# Patient Record
Sex: Female | Born: 1990 | Race: White | Hispanic: No | Marital: Single | Smoking: Former smoker
Health system: Southern US, Community
[De-identification: ages and names within clinical notes are randomized; demographics above are authoritative.]

## PROBLEM LIST (undated history)

## (undated) HISTORY — PX: DILATION AND CURETTAGE OF UTERUS: SHX78

---
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
## 2010-07-02 LAB — CBC
Hemoglobin: 12.5 g/dL (ref 12.0–15.0)
MCHC: 35.1 g/dL (ref 30.0–36.0)
MCV: 90.2 fL (ref 78.0–100.0)
RDW: 12.3 % (ref 11.5–15.5)
WBC: 8.5 10*3/uL (ref 4.0–10.5)

## 2010-07-02 LAB — ABO/RH: ABO/RH(D): O POS

## 2010-07-09 LAB — URINALYSIS, ROUTINE W REFLEX MICROSCOPIC
Bilirubin Urine: NEGATIVE
Ketones, ur: NEGATIVE mg/dL
Leukocytes, UA: NEGATIVE
Specific Gravity, Urine: 1.022 (ref 1.005–1.030)

## 2010-07-09 LAB — URINE MICROSCOPIC-ADD ON

## 2010-07-09 LAB — POCT I-STAT, CHEM 8
BUN: 10 mg/dL (ref 6–23)
Calcium, Ion: 1.16 mmol/L (ref 1.12–1.32)
Chloride: 108 mEq/L (ref 96–112)
HCT: 44 % (ref 36.0–46.0)
Potassium: 4 mEq/L (ref 3.5–5.1)
TCO2: 26 mmol/L (ref 0–100)

## 2010-07-09 LAB — DIFFERENTIAL
Basophils Relative: 1 % (ref 0–1)
Eosinophils Absolute: 0.1 10*3/uL (ref 0.0–0.7)
Eosinophils Relative: 2 % (ref 0–5)
Lymphocytes Relative: 18 % (ref 12–46)
Lymphs Abs: 1.4 10*3/uL (ref 0.7–4.0)
Monocytes Absolute: 0.8 10*3/uL (ref 0.1–1.0)
Neutrophils Relative %: 69 % (ref 43–77)

## 2010-07-09 LAB — CBC
Hemoglobin: 14.1 g/dL (ref 12.0–15.0)
MCHC: 33 g/dL (ref 30.0–36.0)
Platelets: 225 10*3/uL (ref 150–400)

## 2010-09-10 ENCOUNTER — Emergency Department (HOSPITAL_COMMUNITY): Payer: Medicaid Other

## 2010-09-10 ENCOUNTER — Emergency Department (HOSPITAL_COMMUNITY)
Admission: EM | Admit: 2010-09-10 | Discharge: 2010-09-10 | Disposition: A | Discharge status: Home / Self Care | Payer: Medicaid Other | Attending: Emergency Medicine | Admitting: Emergency Medicine

## 2010-09-10 DIAGNOSIS — O021 Missed abortion: Secondary | ICD-10-CM | POA: Insufficient documentation

## 2010-09-10 LAB — CBC
Hemoglobin: 12.2 g/dL (ref 12.0–15.0)
Platelets: 214 10*3/uL (ref 150–400)
RBC: 4.19 MIL/uL (ref 3.87–5.11)
WBC: 8.1 10*3/uL (ref 4.0–10.5)

## 2010-09-10 LAB — DIFFERENTIAL
Basophils Absolute: 0 10*3/uL (ref 0.0–0.1)
Basophils Relative: 0 % (ref 0–1)
Eosinophils Relative: 2 % (ref 0–5)
Lymphs Abs: 2.2 10*3/uL (ref 0.7–4.0)
Monocytes Absolute: 0.7 10*3/uL (ref 0.1–1.0)
Monocytes Relative: 8 % (ref 3–12)
Neutrophils Relative %: 63 % (ref 43–77)

## 2010-09-10 LAB — URINALYSIS, ROUTINE W REFLEX MICROSCOPIC
Bilirubin Urine: NEGATIVE
Ketones, ur: NEGATIVE mg/dL
Leukocytes, UA: NEGATIVE
Protein, ur: NEGATIVE mg/dL
Specific Gravity, Urine: 1.021 (ref 1.005–1.030)
Urobilinogen, UA: 0.2 mg/dL (ref 0.0–1.0)

## 2010-09-10 LAB — ABO/RH: ABO/RH(D): O POS

## 2010-09-10 LAB — WET PREP, GENITAL

## 2010-09-10 LAB — POCT I-STAT, CHEM 8
Chloride: 104 mEq/L (ref 96–112)
Creatinine, Ser: 1.1 mg/dL (ref 0.4–1.2)
Glucose, Bld: 103 mg/dL — ABNORMAL HIGH (ref 70–99)
HCT: 35 % — ABNORMAL LOW (ref 36.0–46.0)
Hemoglobin: 11.9 g/dL — ABNORMAL LOW (ref 12.0–15.0)
Potassium: 3.6 mEq/L (ref 3.5–5.1)
Sodium: 136 mEq/L (ref 135–145)

## 2010-09-10 LAB — URINE MICROSCOPIC-ADD ON

## 2010-09-11 LAB — GC/CHLAMYDIA PROBE AMP, GENITAL: Chlamydia, DNA Probe: NEGATIVE

## 2010-09-12 LAB — URINE CULTURE

## 2010-09-13 ENCOUNTER — Other Ambulatory Visit (HOSPITAL_COMMUNITY): Payer: Self-pay | Admitting: Obstetrics and Gynecology

## 2010-09-13 DIAGNOSIS — O200 Threatened abortion: Secondary | ICD-10-CM

## 2010-09-14 ENCOUNTER — Other Ambulatory Visit (HOSPITAL_COMMUNITY): Payer: Self-pay | Admitting: Obstetrics and Gynecology

## 2010-09-14 ENCOUNTER — Ambulatory Visit (HOSPITAL_COMMUNITY)
Admission: RE | Admit: 2010-09-14 | Discharge: 2010-09-14 | Disposition: A | Discharge status: Home / Self Care | Payer: Medicaid Other | Source: Ambulatory Visit | Attending: Obstetrics and Gynecology | Admitting: Obstetrics and Gynecology

## 2010-09-14 DIAGNOSIS — O200 Threatened abortion: Secondary | ICD-10-CM

## 2010-09-14 DIAGNOSIS — O99330 Smoking (tobacco) complicating pregnancy, unspecified trimester: Secondary | ICD-10-CM | POA: Insufficient documentation

## 2010-09-14 DIAGNOSIS — O209 Hemorrhage in early pregnancy, unspecified: Secondary | ICD-10-CM | POA: Insufficient documentation

## 2010-09-14 DIAGNOSIS — O368390 Maternal care for abnormalities of the fetal heart rate or rhythm, unspecified trimester, not applicable or unspecified: Secondary | ICD-10-CM | POA: Insufficient documentation

## 2010-09-16 ENCOUNTER — Other Ambulatory Visit: Payer: Self-pay | Admitting: Obstetrics and Gynecology

## 2010-09-16 ENCOUNTER — Inpatient Hospital Stay (HOSPITAL_COMMUNITY)
Admission: AD | Admit: 2010-09-16 | Discharge: 2010-09-16 | Disposition: A | Discharge status: Home / Self Care | Payer: Medicaid Other | Source: Ambulatory Visit | Attending: Obstetrics and Gynecology | Admitting: Obstetrics and Gynecology

## 2010-09-16 DIAGNOSIS — O200 Threatened abortion: Secondary | ICD-10-CM | POA: Insufficient documentation

## 2010-09-16 LAB — CBC
HCT: 37.3 % (ref 36.0–46.0)
MCH: 29.9 pg (ref 26.0–34.0)
MCV: 86.3 fL (ref 78.0–100.0)
RBC: 4.32 MIL/uL (ref 3.87–5.11)
WBC: 16.5 10*3/uL — ABNORMAL HIGH (ref 4.0–10.5)

## 2011-01-18 ENCOUNTER — Emergency Department (HOSPITAL_COMMUNITY)
Admission: EM | Admit: 2011-01-18 | Discharge: 2011-01-19 | Disposition: A | Discharge status: Home / Self Care | Payer: No Typology Code available for payment source | Attending: Emergency Medicine | Admitting: Emergency Medicine

## 2011-01-18 DIAGNOSIS — R0789 Other chest pain: Secondary | ICD-10-CM | POA: Insufficient documentation

## 2011-01-18 DIAGNOSIS — S0990XA Unspecified injury of head, initial encounter: Secondary | ICD-10-CM | POA: Insufficient documentation

## 2011-01-18 DIAGNOSIS — R51 Headache: Secondary | ICD-10-CM | POA: Insufficient documentation

## 2011-01-18 DIAGNOSIS — M2550 Pain in unspecified joint: Secondary | ICD-10-CM | POA: Insufficient documentation

## 2011-01-18 DIAGNOSIS — S62319A Displaced fracture of base of unspecified metacarpal bone, initial encounter for closed fracture: Secondary | ICD-10-CM | POA: Insufficient documentation

## 2011-01-18 DIAGNOSIS — M79609 Pain in unspecified limb: Secondary | ICD-10-CM | POA: Insufficient documentation

## 2011-01-19 ENCOUNTER — Emergency Department (HOSPITAL_COMMUNITY): Payer: No Typology Code available for payment source

## 2011-10-23 ENCOUNTER — Emergency Department (HOSPITAL_COMMUNITY): Payer: No Typology Code available for payment source

## 2011-10-23 ENCOUNTER — Emergency Department (HOSPITAL_COMMUNITY)
Admission: EM | Admit: 2011-10-23 | Discharge: 2011-10-23 | Disposition: A | Discharge status: Home / Self Care | Payer: No Typology Code available for payment source | Attending: Family Medicine | Admitting: Family Medicine

## 2011-10-23 ENCOUNTER — Encounter (HOSPITAL_COMMUNITY): Payer: Self-pay | Admitting: *Deleted

## 2011-10-23 DIAGNOSIS — IMO0002 Reserved for concepts with insufficient information to code with codable children: Secondary | ICD-10-CM | POA: Insufficient documentation

## 2011-10-23 DIAGNOSIS — S43402A Unspecified sprain of left shoulder joint, initial encounter: Secondary | ICD-10-CM

## 2011-10-23 DIAGNOSIS — Y92009 Unspecified place in unspecified non-institutional (private) residence as the place of occurrence of the external cause: Secondary | ICD-10-CM | POA: Insufficient documentation

## 2011-10-23 DIAGNOSIS — S0003XA Contusion of scalp, initial encounter: Secondary | ICD-10-CM | POA: Insufficient documentation

## 2011-10-23 DIAGNOSIS — F17200 Nicotine dependence, unspecified, uncomplicated: Secondary | ICD-10-CM | POA: Insufficient documentation

## 2011-10-23 DIAGNOSIS — S0083XA Contusion of other part of head, initial encounter: Secondary | ICD-10-CM

## 2011-10-23 DIAGNOSIS — S80219A Abrasion, unspecified knee, initial encounter: Secondary | ICD-10-CM

## 2011-10-23 NOTE — ED Provider Notes (Signed)
Medical screening examination/treatment/procedure(s) were performed by non-physician practitioner and as supervising physician I was immediately available for consultation/collaboration. Taesean Reth, MD, FACEP   Tyna Huertas L Denney Shein, MD 10/23/11 2324 

## 2011-10-23 NOTE — ED Notes (Signed)
Ortho called for sling 
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
## 2011-10-23 NOTE — ED Notes (Signed)
Pt states she did not have any loc. Pt states she is only having neck pain
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
## 2012-07-23 ENCOUNTER — Other Ambulatory Visit: Payer: Self-pay | Admitting: Family Medicine

## 2012-07-23 DIAGNOSIS — M25512 Pain in left shoulder: Secondary | ICD-10-CM

## 2012-07-24 ENCOUNTER — Other Ambulatory Visit: Payer: No Typology Code available for payment source

## 2012-07-26 ENCOUNTER — Ambulatory Visit
Admission: RE | Admit: 2012-07-26 | Discharge: 2012-07-26 | Disposition: A | Discharge status: Home / Self Care | Payer: Federal, State, Local not specified - PPO | Source: Ambulatory Visit | Attending: Family Medicine | Admitting: Family Medicine

## 2012-07-26 DIAGNOSIS — M25512 Pain in left shoulder: Secondary | ICD-10-CM

## 2013-03-09 IMAGING — US US OB TRANSVAGINAL
1 series · 13 of 28 positions shown · non-contrast
Comparison: None

CLINICAL DATA: Pregnant, abdominal pain, vaginal bleeding;
quantitative beta HCG 5141 on 09/10/2010

OBSTETRIC <14 WK US AND TRANSVAGINAL OB US
TECHNIQUE: Both transabdominal and transvaginal ultrasound
examinations were performed for complete evaluation of the
gestation as well as the maternal uterus, adnexal regions, and
pelvic cul-de-sac.  Transvaginal technique was performed to assess
early pregnancy.

[Series 1: us ob transvaginal · 0.26mm/px · 13 of 47 slices shown]
[im 2/47]
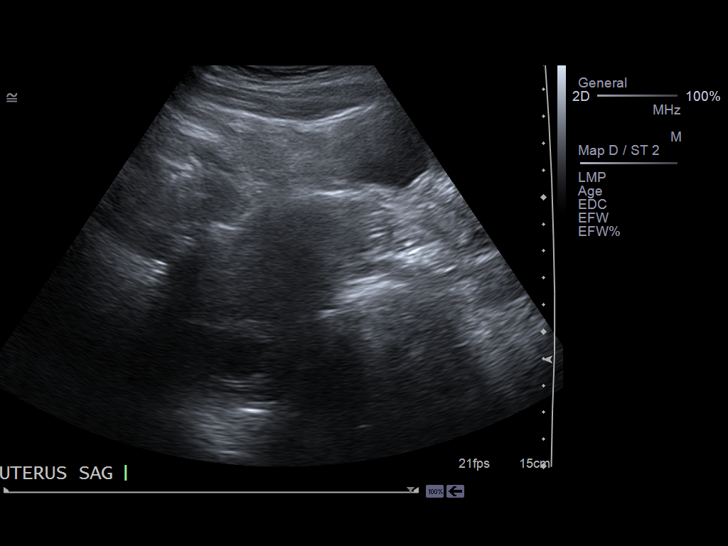
[im 6/47]
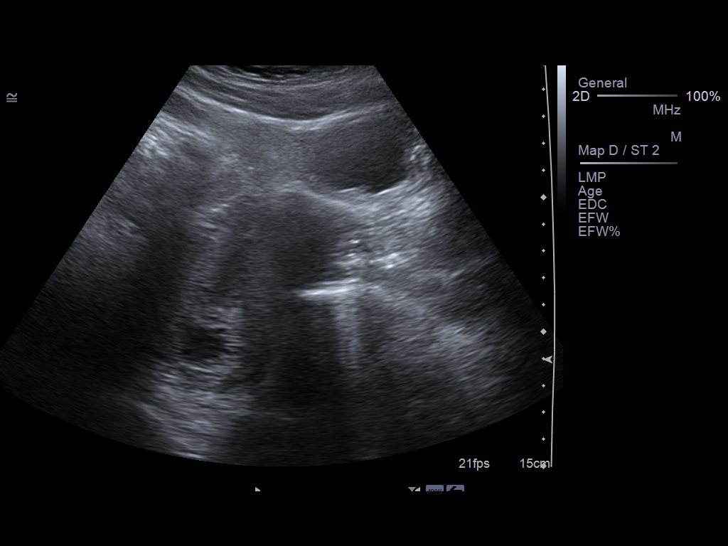
[im 9/47]
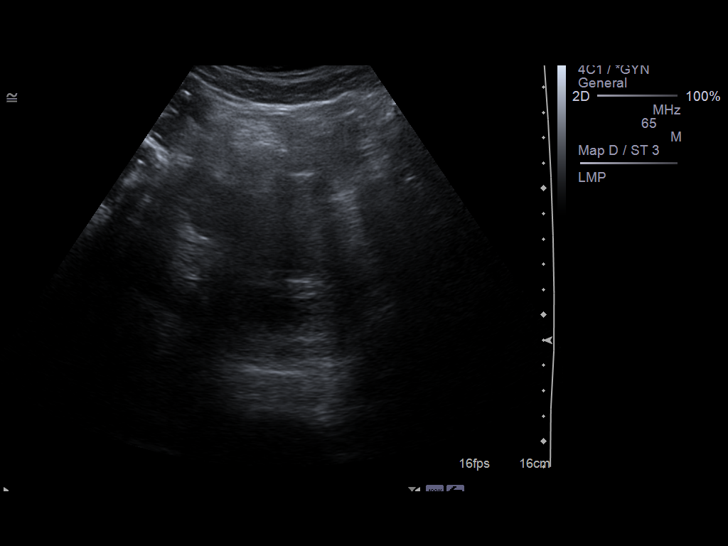
[im 12/47]
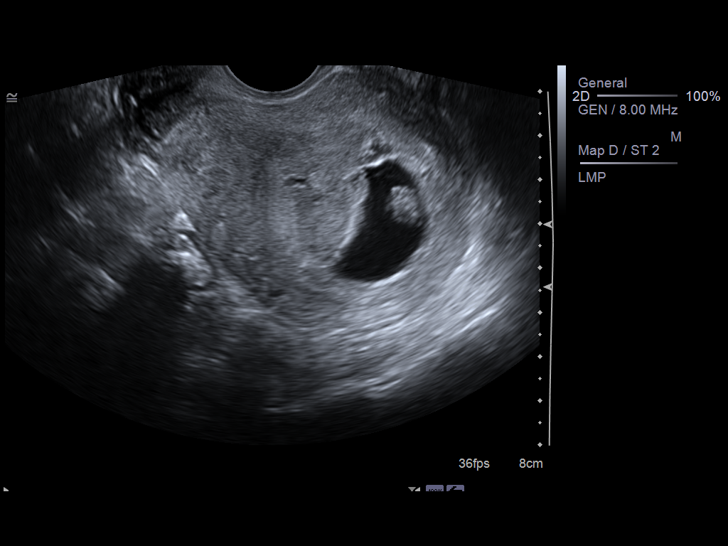
[im 16/47]
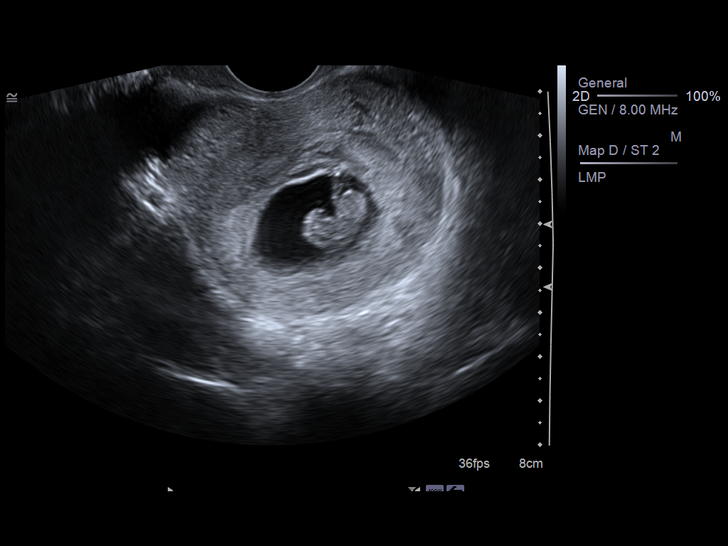
[im 19/47]
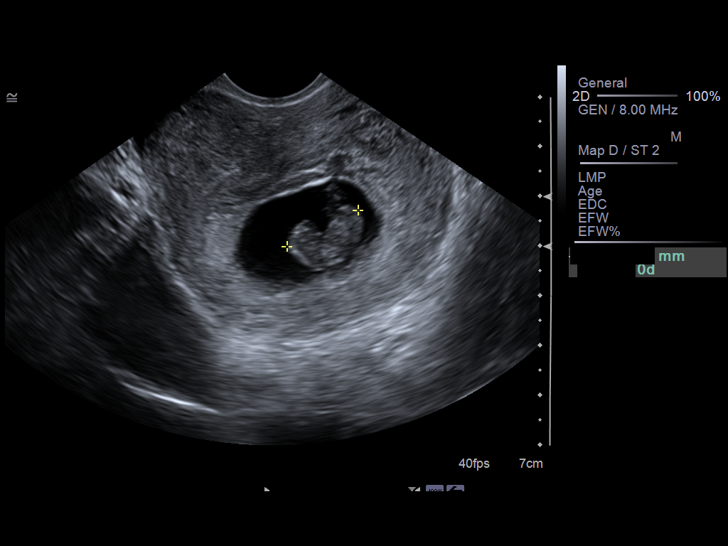
[im 24/47]
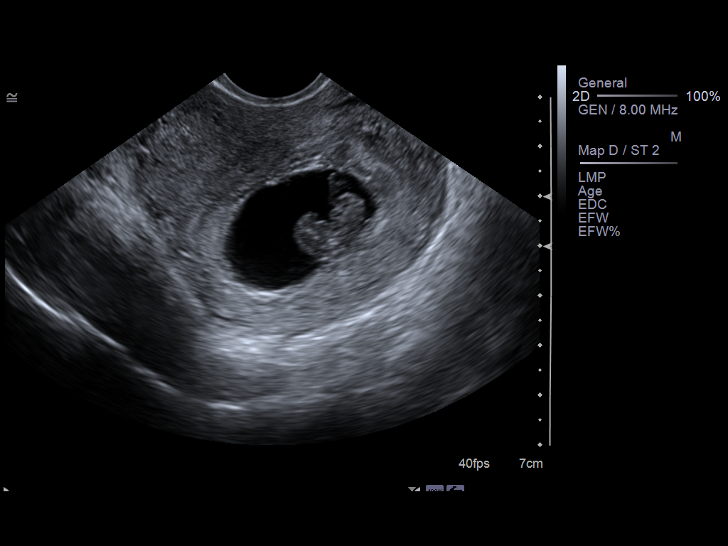
[im 28/47]
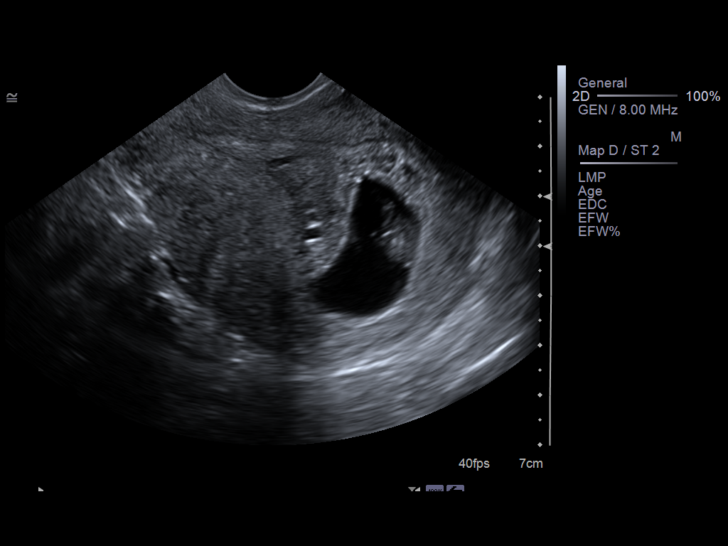
[im 31/47]
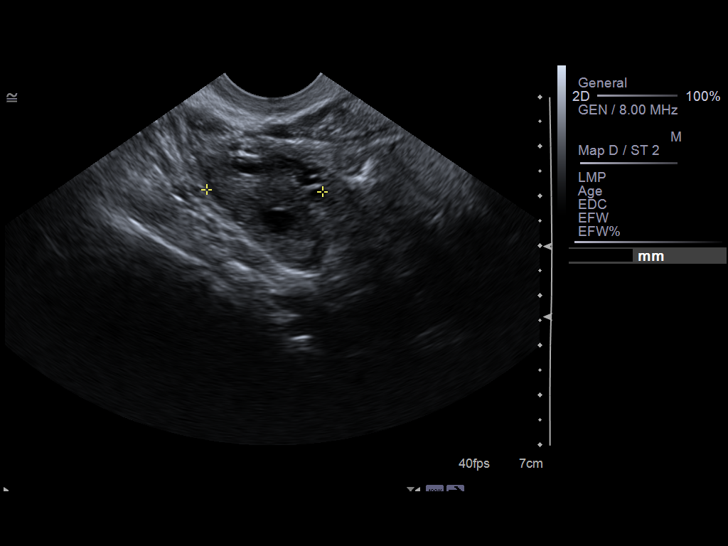
[im 35/47]
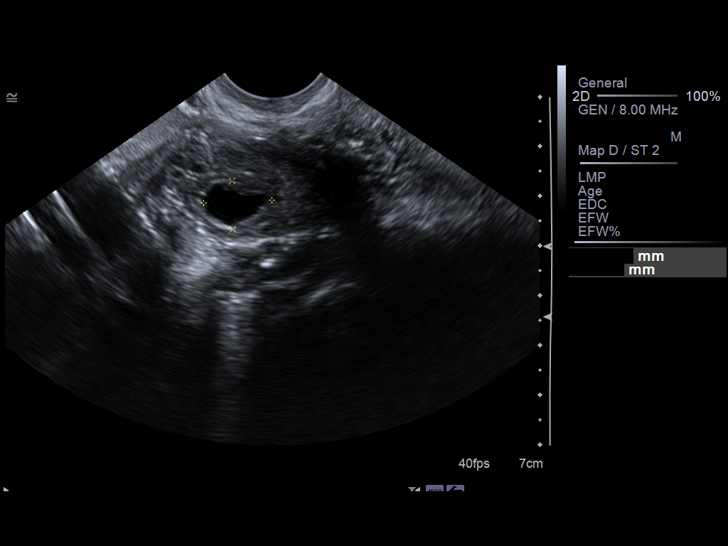
[im 38/47]
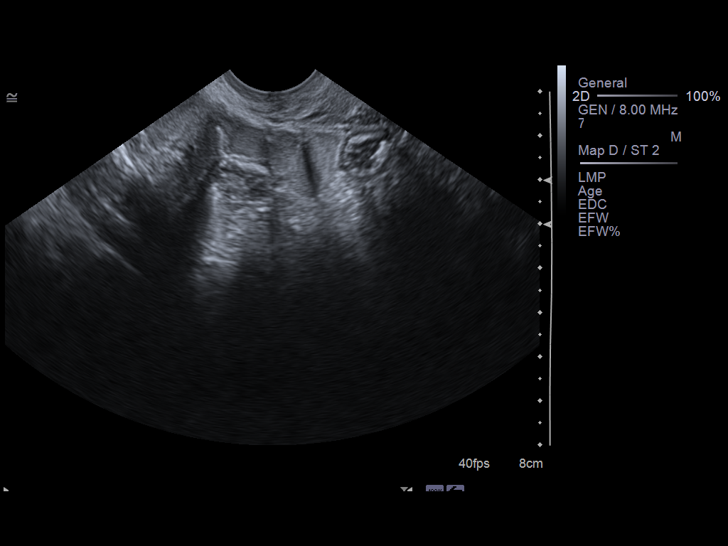
[im 41/47]
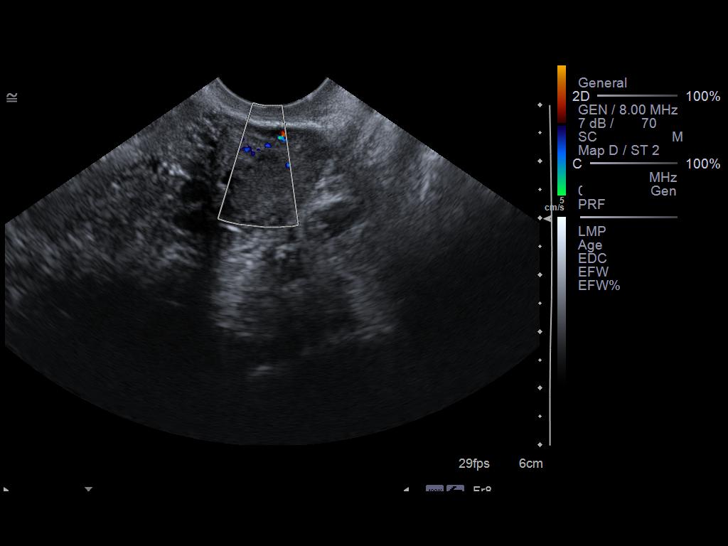
[im 45/47]
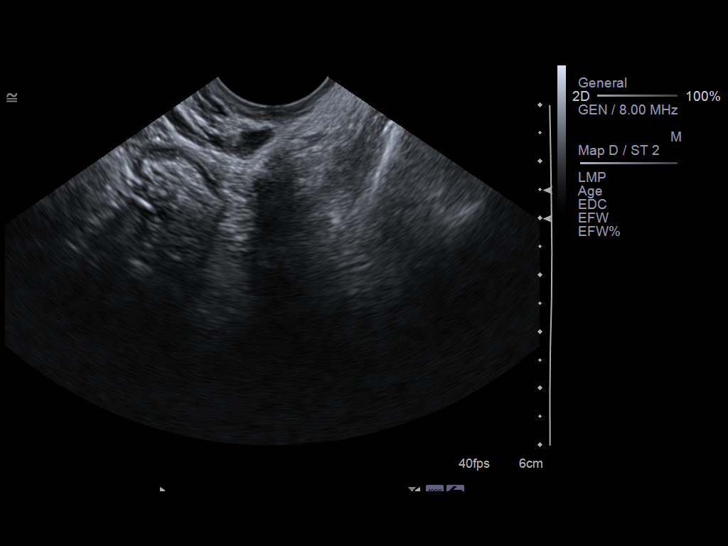

[13 of 28 positions shown; findings below may reference images not displayed]

Intrauterine gestational sac:  Visualized/abnormal shape
Yolk sac: Not visualized
Embryo: Visualized
Cardiac Activity: Not detected
Heart Rate: N/A bpm

CRL:   15.9 mm  8 w   0 d           US EDC: 05/02/2011

Maternal uterus/adnexae:
Small to moderate sized subchorionic hemorrhage, slightly deforming
gestational sac.
Right ovary normal size and morphology, 3.3 x 2.3 x 2.3 cm.
Left ovary normal size and morphology, 3.1 x 1.7 x 2.1 cm.
No adnexal masses or free pelvic fluid.
IMPRESSION: Single intrauterine gestation identified within uterus, measuring 8
weeks 0 days EGA based on crown-rump length.
Small to moderate sized subchorionic hemorrhage.
No fetal cardiac activity is identified, though this is expected to
be present at this size of fetal pole and estimated gestational
age.
Findings are most consistent with fetal demise.

## 2019-07-15 ENCOUNTER — Ambulatory Visit (INDEPENDENT_AMBULATORY_CARE_PROVIDER_SITE_OTHER): Payer: Medicaid Other

## 2019-07-15 ENCOUNTER — Other Ambulatory Visit: Payer: Self-pay

## 2019-07-15 VITALS — BP 134/78 | HR 109 | Temp 98.7°F | Ht 67.0 in | Wt 177.1 lb

## 2019-07-15 DIAGNOSIS — Z3490 Encounter for supervision of normal pregnancy, unspecified, unspecified trimester: Secondary | ICD-10-CM

## 2019-07-15 DIAGNOSIS — Z3201 Encounter for pregnancy test, result positive: Secondary | ICD-10-CM | POA: Diagnosis not present

## 2019-07-15 LAB — POCT URINE PREGNANCY: Preg Test, Ur: POSITIVE — AB

## 2019-07-15 MED ORDER — BLOOD PRESSURE KIT DEVI: 1.0000 | 0 refills | Status: AC

## 2019-07-15 NOTE — Progress Notes (Signed)
Ms. Fiske presents today for UPT. She has no unusual complaints.  LMP: 05/12/2019 EDD  02/16/2020  [redacted]w[redacted]d    OBJECTIVE: Appears well, in no apparent distress.  OB History    Gravida  3   Para      Term      Preterm      AB  2   Living  0     SAB      TAB      Ectopic      Multiple      Live Births             Home UPT Result: POSITIVE X2 In-Office UPT result: POSITIVE  I have reviewed the patient's medical, obstetrical, social, and family histories, and medications.   ASSESSMENT: Positive pregnancy test  PLAN Prenatal care to be completed at: [facility] BP Cuff ordered

## 2019-08-04 ENCOUNTER — Other Ambulatory Visit: Payer: Self-pay

## 2019-08-04 ENCOUNTER — Other Ambulatory Visit (HOSPITAL_COMMUNITY)
Admission: RE | Admit: 2019-08-04 | Discharge: 2019-08-04 | Disposition: A | Discharge status: Home / Self Care | Payer: Medicaid Other | Source: Ambulatory Visit | Attending: Obstetrics & Gynecology | Admitting: Obstetrics & Gynecology

## 2019-08-04 ENCOUNTER — Ambulatory Visit (INDEPENDENT_AMBULATORY_CARE_PROVIDER_SITE_OTHER): Payer: Medicaid Other | Admitting: Obstetrics & Gynecology

## 2019-08-04 ENCOUNTER — Encounter: Payer: Self-pay | Admitting: Obstetrics & Gynecology

## 2019-08-04 VITALS — BP 118/75 | HR 111 | Wt 178.1 lb

## 2019-08-04 DIAGNOSIS — F17210 Nicotine dependence, cigarettes, uncomplicated: Secondary | ICD-10-CM | POA: Diagnosis not present

## 2019-08-04 DIAGNOSIS — Z3490 Encounter for supervision of normal pregnancy, unspecified, unspecified trimester: Secondary | ICD-10-CM | POA: Insufficient documentation

## 2019-08-04 NOTE — Progress Notes (Signed)
  Subjective:    Suzanne Gross is a [id number] [redacted]w[redacted]d being seen today for her first obstetrical visit.  Her obstetrical history is significant for smoker. Patient does intend to breast feed. Pregnancy history fully reviewed.  Patient reports no complaints.  Vitals:   08/04/19 0814  BP: 118/75  Pulse: (!) 111  Weight: 178 lb 1.6 oz (80.8 kg)    HISTORY: OB History  Gravida Para Term Preterm AB Living  3       2 0  SAB TAB Ectopic Multiple Live Births               # Outcome Date GA Lbr Len/2nd Weight Sex Delivery Anes PTL Lv  3 Current           2 AB           1 AB            No past medical history on file. Past Surgical History:  Procedure Laterality Date  . DILATION AND CURETTAGE OF UTERUS     No family history on file.   Exam    Uterus:     Pelvic Exam:    Perineum: No Hemorrhoids   Vulva: normal   Vagina:  normal mucosa   pH:     Cervix: no lesions   Adnexa: normal adnexa   Bony Pelvis: average  System: Breast:  normal appearance, no masses or tenderness   Skin: normal coloration and turgor, no rashes    Neurologic: oriented, normal mood   Extremities: normal strength, tone, and muscle mass   HEENT PERRLA and thyroid without masses   Mouth/Teeth mucous membranes moist, pharynx normal without lesions and few missing teeth   Neck supple   Cardiovascular: regular rate and rhythm, no murmurs or gallops   Respiratory:  appears well, vitals normal, no respiratory distress, acyanotic, normal RR, neck free of mass or lymphadenopathy, chest clear, no wheezing, crepitations, rhonchi, normal symmetric air entry   Abdomen: soft, non-tender; bowel sounds normal; no masses,  no organomegaly   Urinary: urethral meatus normal      Assessment:    Pregnancy: [id number] Patient Active Problem List   Diagnosis Date Noted  . Cigarette smoker 08/04/2019  . Supervision of normal pregnancy, antepartum 07/15/2019        Plan:     Initial labs drawn. Prenatal  vitamins. Problem list reviewed and updated. Genetic Screening discussed [facility] and [facility]  Ultrasound discussed; fetal survey: ordered.  Follow up in 4 weeks. 50% of 30 min visit spent on counseling and coordination of care.  Quit smoking advised   Scheryl Darter 08/04/2019

## 2019-08-04 NOTE — Patient Instructions (Signed)

## 2019-08-05 LAB — OBSTETRIC PANEL, INCLUDING HIV
Antibody Screen: NEGATIVE
Basophils Absolute: 0.1 10*3/uL (ref 0.0–0.2)
Basos: 0 %
EOS (ABSOLUTE): 0.3 10*3/uL (ref 0.0–0.4)
Eos: 3 %
HIV Screen 4th Generation wRfx: NONREACTIVE
Hematocrit: 39.2 % (ref 34.0–46.6)
Hemoglobin: 13 g/dL (ref 11.1–15.9)
Hepatitis B Surface Ag: NEGATIVE
Immature Grans (Abs): 0.1 10*3/uL (ref 0.0–0.1)
Immature Granulocytes: 1 %
Lymphocytes Absolute: 2.3 10*3/uL (ref 0.7–3.1)
Lymphs: 18 %
MCH: 31.1 pg (ref 26.6–33.0)
MCHC: 33.2 g/dL (ref 31.5–35.7)
MCV: 94 fL (ref 79–97)
Monocytes Absolute: 1 10*3/uL — ABNORMAL HIGH (ref 0.1–0.9)
Monocytes: 8 %
Neutrophils Absolute: 9 10*3/uL — ABNORMAL HIGH (ref 1.4–7.0)
Neutrophils: 70 %
Platelets: 272 10*3/uL (ref 150–450)
RBC: 4.18 x10E6/uL (ref 3.77–5.28)
RDW: 12.6 % (ref 11.7–15.4)
RPR Ser Ql: NONREACTIVE
Rh Factor: POSITIVE
Rubella Antibodies, IGG: 2.51 index (ref >0.99)
WBC: 12.8 10*3/uL — ABNORMAL HIGH (ref 3.4–10.8)

## 2019-08-05 LAB — CYTOLOGY - PAP
Chlamydia: NEGATIVE
Comment: NEGATIVE
Comment: NORMAL
Diagnosis: NEGATIVE
Neisseria Gonorrhea: NEGATIVE

## 2019-08-07 LAB — CULTURE, OB URINE

## 2019-08-07 LAB — URINE CULTURE, OB REFLEX

## 2019-08-16 ENCOUNTER — Encounter: Payer: Self-pay | Admitting: Obstetrics and Gynecology

## 2019-09-01 ENCOUNTER — Encounter: Payer: Self-pay | Admitting: Obstetrics and Gynecology

## 2019-09-01 ENCOUNTER — Other Ambulatory Visit: Payer: Self-pay

## 2019-09-01 ENCOUNTER — Encounter: Payer: Self-pay | Admitting: Emergency Medicine

## 2019-09-01 ENCOUNTER — Ambulatory Visit
Admission: EM | Admit: 2019-09-01 | Discharge: 2019-09-01 | Disposition: A | Discharge status: Home / Self Care | Payer: Medicaid Other

## 2019-09-01 ENCOUNTER — Telehealth (INDEPENDENT_AMBULATORY_CARE_PROVIDER_SITE_OTHER): Payer: Medicaid Other | Admitting: Obstetrics and Gynecology

## 2019-09-01 DIAGNOSIS — Z3490 Encounter for supervision of normal pregnancy, unspecified, unspecified trimester: Secondary | ICD-10-CM

## 2019-09-01 DIAGNOSIS — M79601 Pain in right arm: Secondary | ICD-10-CM

## 2019-09-01 NOTE — ED Triage Notes (Signed)
Patient has a superficial wound on right forearm.  Patient reports swelling and a pulling sensation in forearm.  Reports there was a small bruise  on distal forearm yesterday.  No bruise visible today

## 2019-09-01 NOTE — Progress Notes (Signed)
Pt did not answer for her OB visit. She will be called to reschedule.   Baldemar Lenis, M.D. Attending [facility])

## 2019-09-01 NOTE — Discharge Instructions (Signed)
Ice compress to the area. Tylenol for pain. If noticing spreading redness, warmth, follow up for reevaluation. If symptoms not improving, follow up with sports medicine for further evaluation.

## 2019-09-01 NOTE — ED Provider Notes (Signed)
[facility]    CSN: [id number] Arrival date & time: 09/01/19  1818      History   Chief Complaint Chief Complaint  Patient presents with  . Arm Problem    HPI Suzanne Gross is a 29 y.o. female.   29 year old female who is [redacted] weeks pregnant comes in for 2-day history of right forearm pain.  Noticed swelling to the area, with small bruise and superficial wound.  Bruising has since resolved, but has felt swelling getting larger.  Has numbness/tingling to the hand, and pulling sensation to the forearm with some movements. Denies itching, erythema, warmth. Denies fever. No known injury. Works at [employer].      History reviewed. No pertinent past medical history.  Patient Active Problem List   Diagnosis Date Noted  . Cigarette smoker 08/04/2019  . Supervision of normal pregnancy, antepartum 07/15/2019    Past Surgical History:  Procedure Laterality Date  . DILATION AND CURETTAGE OF UTERUS      OB History    Gravida  3   Para      Term      Preterm      AB  2   Living  0     SAB      TAB      Ectopic      Multiple      Live Births               Home Medications    Prior to Admission medications   Medication Sig Start Date End Date Taking? Authorizing Provider  Prenatal MV-Min-Fe Fum-FA-DHA (PRENATAL 1 PO) Take by mouth.   Yes [provider]  Blood Pressure Monitoring (BLOOD PRESSURE KIT) DEVI 1 kit by Does not apply route once a week. Check Blood Pressure regularly and record readings into the Babyscripts App.  Large Cuff.  DX O90.0 07/15/19   Shelly Bombard, MD  etonogestrel (IMPLANON) 68 MG IMPL implant Inject 1 each into the skin once.  09/01/19  [provider]    Family History History reviewed. No pertinent family history.  Social History Social History   Tobacco Use  . Smoking status: Former Smoker    Types: Cigarettes    Quit date: 07/13/2019    Years since quitting: 0.1  . Smokeless tobacco: Never  Used  Substance Use Topics  . Alcohol use: Yes    Comment: occ  . Drug use: No     Allergies   Patient has no known allergies.   Review of Systems Review of Systems  Reason unable to perform ROS: See HPI as above.     Physical Exam Triage Vital Signs ED Triage Vitals  Enc Vitals Group     BP 09/01/19 1835 103/60     Pulse Rate 09/01/19 1835 90     Resp 09/01/19 1835 18     Temp 09/01/19 1835 98.5 F (36.9 C)     Temp Source 09/01/19 1835 Oral     SpO2 09/01/19 1835 96 %     Weight --      Height --      Head Circumference --      Peak Flow --      Pain Score 09/01/19 1832 7     Pain Loc --      Pain Edu? --      Excl. in [place]? --    No data found.  Updated Vital Signs BP 103/60 (BP Location: Left Arm)  Pulse 90   Temp 98.5 F (36.9 C) (Oral)   Resp 18   LMP 05/12/2019 (Approximate)   SpO2 96%   Visual Acuity Right Eye Distance:   Left Eye Distance:   Bilateral Distance:    Right Eye Near:   Left Eye Near:    Bilateral Near:     Physical Exam Constitutional:      General: She is not in acute distress.    Appearance: Normal appearance. She is well-developed. She is not toxic-appearing or diaphoretic.  HENT:     Head: Normocephalic and atraumatic.  Eyes:     Conjunctiva/sclera: Conjunctivae normal.     Pupils: Pupils are equal, round, and reactive to light.  Pulmonary:     Effort: Pulmonary effort is normal. No respiratory distress.     Comments: Speaking in full sentences without difficulty Musculoskeletal:       Arms:     Cervical back: Normal range of motion and neck supple.     Comments: Full ROM of elbow, wrist, fingers. Strength 5/5 BUE. Sensation 3-4/5 of right 4th and 5th digit. Radial pulse 2+, cap refill <2s  Skin:    General: Skin is warm and dry.  Neurological:     Mental Status: She is alert and oriented to person, place, and time.      UC Treatments / Results  Labs (all labs ordered are listed, but only abnormal results  are displayed) Labs Reviewed - No data to display  EKG   Radiology No results found.  Procedures Procedures (including critical care time)  Medications Ordered in UC Medications - No data to display  Initial Impression / Assessment and Plan / UC Course  I have reviewed the triage vital signs and the nursing notes.  Pertinent labs & imaging results that were available during my care of the patient were reviewed by me and considered in my medical decision making (see chart for details).    No alarming signs on exam. Will start ice compress, tylenol for pain. Discussed to follow up with sports medicine if symptoms not improving. Return precautions given.  Final Clinical Impressions(s) / UC Diagnoses   Final diagnoses:  Right arm pain   ED Prescriptions    None     PDMP not reviewed this encounter.   Ok Edwards, PA-C 09/01/19 [phone]

## 2019-09-01 NOTE — Progress Notes (Signed)
Virtual ROB   CC:    

## 2019-09-09 ENCOUNTER — Telehealth: Payer: Self-pay

## 2019-09-09 ENCOUNTER — Telehealth (INDEPENDENT_AMBULATORY_CARE_PROVIDER_SITE_OTHER): Payer: Medicaid Other | Admitting: Obstetrics

## 2019-09-09 DIAGNOSIS — Z3490 Encounter for supervision of normal pregnancy, unspecified, unspecified trimester: Secondary | ICD-10-CM

## 2019-09-09 NOTE — Progress Notes (Signed)
Patient did not answer nurse's phone call  Brock Bad, MD 09/09/2019 4:15 PM

## 2019-09-09 NOTE — Telephone Encounter (Signed)
Unable to reach pt x2 for video visit

## 2019-09-22 ENCOUNTER — Ambulatory Visit: Payer: Medicaid Other | Attending: Obstetrics & Gynecology

## 2019-09-30 ENCOUNTER — Ambulatory Visit
Admission: EM | Admit: 2019-09-30 | Discharge: 2019-09-30 | Disposition: A | Discharge status: Home / Self Care | Payer: Medicaid Other | Attending: Physician Assistant | Admitting: Physician Assistant

## 2019-09-30 ENCOUNTER — Other Ambulatory Visit: Payer: Self-pay

## 2019-09-30 ENCOUNTER — Encounter: Payer: Self-pay | Admitting: Physician Assistant

## 2019-09-30 DIAGNOSIS — M25571 Pain in right ankle and joints of right foot: Secondary | ICD-10-CM

## 2019-09-30 DIAGNOSIS — M79671 Pain in right foot: Secondary | ICD-10-CM

## 2019-09-30 NOTE — ED Triage Notes (Signed)
Seen by provider

## 2019-09-30 NOTE — ED Provider Notes (Signed)
[facility]    CSN: [id number] Arrival date & time: 09/30/19  1438      History   Chief Complaint Chief Complaint  Patient presents with   Foot Pain    HPI Suzanne Gross is a 29 y.o. female.   29 year old female who is [redacted] weeks pregnant comes in for 3 day of right foot/ankle pain. Denies injury/trauma. Pain is to the right lateral ankle, mostly during ROM or weightbearing. Denies pain at rest. Denies erythema, warmth, numbness/tingling. Ice compress without relief.   Good fetal movement.      History reviewed. No pertinent past medical history.  Patient Active Problem List   Diagnosis Date Noted   Cigarette smoker 08/04/2019   Supervision of normal pregnancy, antepartum 07/15/2019    Past Surgical History:  Procedure Laterality Date   DILATION AND CURETTAGE OF UTERUS      OB History    Gravida  3   Para      Term      Preterm      AB  2   Living  0     SAB      TAB      Ectopic      Multiple      Live Births               Home Medications    Prior to Admission medications   Medication Sig Start Date End Date Taking? Authorizing Provider  Blood Pressure Monitoring (BLOOD PRESSURE KIT) [place] 1 kit by Does not apply route once a week. Check Blood Pressure regularly and record readings into the Babys[employer]s App.  Large Cuff.  DX O90.0 07/15/19   Shelly Bombard, MD  Prenatal MV-Min-Fe Fum-FA-DHA (PRENATAL 1 PO) Take by mouth.    [provider]  etonogestrel (IMPLANON) 68 MG IMPL implant Inject 1 each into the skin once.  09/01/19  [provider]    Family History History reviewed. No pertinent family history.  Social History Social History   Tobacco Use   Smoking status: Former Smoker    Types: Cigarettes    Quit date: 07/13/2019    Years since quitting: 0.2   Smokeless tobacco: Never Used  Vaping Use   Vaping Use: Never used  Substance Use Topics   Alcohol use: Yes    Comment: occ    Drug use: No     Allergies   Patient has no known allergies.   Review of Systems Review of Systems  Reason unable to perform ROS: See HPI as above.     Physical Exam Triage Vital Signs ED Triage Vitals [09/30/19 1448]  Enc Vitals Group     BP 111/71     Pulse Rate 80     Resp 16     Temp 98.2 F (36.8 C)     Temp Source Oral     SpO2 97 %     Weight      Height      Head Circumference      Peak Flow      Pain Score      Pain Loc      Pain Edu?      Excl. in [place]?    No data found.  Updated Vital Signs BP 111/71 (BP Location: Left Arm)    Pulse 80    Temp 98.2 F (36.8 C) (Oral)    Resp 16    LMP 05/12/2019 (Approximate)  SpO2 97%   Physical Exam Constitutional:      General: She is not in acute distress.    Appearance: Normal appearance. She is well-developed. She is not toxic-appearing or diaphoretic.  HENT:     Head: Normocephalic and atraumatic.  Eyes:     Conjunctiva/sclera: Conjunctivae normal.     Pupils: Pupils are equal, round, and reactive to light.  Pulmonary:     Effort: Pulmonary effort is normal. No respiratory distress.     Comments: Speaking in full sentences without difficulty Musculoskeletal:     Cervical back: Normal range of motion and neck supple.     Comments: No swelling, erythema, warmth, contusion. Tenderness to palpation of right lateral ankle, inferior of malleolus. Tenderness to palpation along 5th MTP. Full ROM of ankle, toes. Strength 5/5. Sensation intact. Pedal pulse 2+  Skin:    General: Skin is warm and dry.  Neurological:     Mental Status: She is alert and oriented to person, place, and time.     UC Treatments / Results  Labs (all labs ordered are listed, but only abnormal results are displayed) Labs Reviewed - No data to display  EKG   Radiology No results found.  Procedures Procedures (including critical care time)  Medications Ordered in UC Medications - No data to display  Initial Impression /  Assessment and Plan / UC Course  I have reviewed the triage vital signs and the nursing notes.  Pertinent labs & imaging results that were available during my care of the patient were reviewed by me and considered in my medical decision making (see chart for details).    Nontraumatic right ankle/foot pain. Likely inflammatory in nature. Patient currently pregnant, unable to use NSAIDs/prednisone. Will do ice compress, tylenol for pain, ace wrap. Return precautions given. Otherwise to follow up with OB for monitoring.  Final Clinical Impressions(s) / UC Diagnoses   Final diagnoses:  Acute right ankle pain  Right foot pain   ED Prescriptions    None     PDMP not reviewed this encounter.   Ok Edwards, PA-C 09/30/19 1524

## 2019-09-30 NOTE — Discharge Instructions (Signed)
No alarming signs on exam. Continue ice compress 10-53mins at a time, 2-3 times a day. Tylenol as needed for pain. Ace wrap during movement. Contact OB if needing stronger pain medicine. Follow up with OBGYN as scheduled for monitoring.
